# Patient Record
Sex: Female | Born: 2000 | Race: Black or African American | Hispanic: No | Marital: Single | Smoking: Never smoker
Health system: Southern US, Community
[De-identification: ages and names within clinical notes are randomized; demographics above are authoritative.]

## PROBLEM LIST (undated history)

## (undated) DIAGNOSIS — J45909 Unspecified asthma, uncomplicated: Secondary | ICD-10-CM

## (undated) DIAGNOSIS — R519 Headache, unspecified: Secondary | ICD-10-CM

## (undated) HISTORY — DX: Headache, unspecified: R51.9

## (undated) HISTORY — DX: Unspecified asthma, uncomplicated: J45.909

---

## 2019-09-04 ENCOUNTER — Ambulatory Visit: Payer: Self-pay | Attending: Family

## 2019-09-04 DIAGNOSIS — Z23 Encounter for immunization: Secondary | ICD-10-CM

## 2019-09-04 NOTE — Progress Notes (Signed)
   Covid-19 Vaccination Clinic  Name:  Carrie Santana    MRN: [id number] DOB: [birth year]  09/04/2019  [name] was observed post Covid-19 immunization for 15 minutes without incident. She was provided with Vaccine Information Sheet and instruction to access the V-Safe system.   [name] was instructed to call 911 with any severe reactions post vaccine: Marland Kitchen Difficulty breathing  . Swelling of face and throat  . A fast heartbeat  . A bad rash all over body  . Dizziness and weakness   Immunizations Administered    Name Date Dose VIS Date Route   Moderna COVID-19 Vaccine 09/04/2019 10:15 AM 0.5 mL 05/13/2019 Intramuscular   Manufacturer: Moderna   Lot: [id number]   NDC: 62952-841-32

## 2019-09-24 DIAGNOSIS — R05 Cough: Secondary | ICD-10-CM | POA: Diagnosis not present

## 2019-09-24 DIAGNOSIS — J301 Allergic rhinitis due to pollen: Secondary | ICD-10-CM | POA: Diagnosis not present

## 2019-10-07 ENCOUNTER — Ambulatory Visit: Payer: Self-pay | Attending: Family

## 2019-10-07 DIAGNOSIS — Z23 Encounter for immunization: Secondary | ICD-10-CM

## 2019-10-07 NOTE — Progress Notes (Signed)
   Covid-19 Vaccination Clinic  Name:  Carrie Santana    MRN: [id number] DOB: 03/22/2001  10/07/2019  Ms. Broccoli was observed post Covid-19 immunization for 15 minutes without incident. She was provided with Vaccine Information Sheet and instruction to access the V-Safe system.   [name] was instructed to call 911 with any severe reactions post vaccine: Marland Kitchen Difficulty breathing  . Swelling of face and throat  . A fast heartbeat  . A bad rash all over body  . Dizziness and weakness   Immunizations Administered    Name Date Dose VIS Date Route   Moderna COVID-19 Vaccine 10/07/2019 10:13 AM 0.5 mL 05/2019 Intramuscular   Manufacturer: Moderna   Lot: [id number]   NDC: 16945-038-88

## 2019-11-28 DIAGNOSIS — Z0000 Encounter for general adult medical examination without abnormal findings: Secondary | ICD-10-CM | POA: Diagnosis not present

## 2019-11-28 DIAGNOSIS — Z113 Encounter for screening for infections with a predominantly sexual mode of transmission: Secondary | ICD-10-CM | POA: Diagnosis not present

## 2019-12-01 DIAGNOSIS — A568 Sexually transmitted chlamydial infection of other sites: Secondary | ICD-10-CM | POA: Diagnosis not present

## 2019-12-01 DIAGNOSIS — Z113 Encounter for screening for infections with a predominantly sexual mode of transmission: Secondary | ICD-10-CM | POA: Diagnosis not present

## 2020-06-09 DIAGNOSIS — Z20822 Contact with and (suspected) exposure to covid-19: Secondary | ICD-10-CM | POA: Diagnosis not present

## 2020-06-09 DIAGNOSIS — R070 Pain in throat: Secondary | ICD-10-CM | POA: Diagnosis not present

## 2020-06-09 DIAGNOSIS — J020 Streptococcal pharyngitis: Secondary | ICD-10-CM | POA: Diagnosis not present

## 2020-09-16 ENCOUNTER — Ambulatory Visit: Payer: Self-pay | Admitting: Family Medicine

## 2020-09-21 ENCOUNTER — Ambulatory Visit: Payer: Self-pay | Admitting: Family Medicine

## 2020-11-05 DIAGNOSIS — Z20822 Contact with and (suspected) exposure to covid-19: Secondary | ICD-10-CM | POA: Diagnosis not present

## 2021-03-15 ENCOUNTER — Other Ambulatory Visit: Payer: Self-pay

## 2021-03-15 ENCOUNTER — Ambulatory Visit (INDEPENDENT_AMBULATORY_CARE_PROVIDER_SITE_OTHER): Payer: BC Managed Care – PPO | Admitting: Family Medicine

## 2021-03-15 VITALS — BP 137/98 | HR 92 | Resp 16 | Ht 65.0 in | Wt 165.4 lb

## 2021-03-15 DIAGNOSIS — J3489 Other specified disorders of nose and nasal sinuses: Secondary | ICD-10-CM | POA: Diagnosis not present

## 2021-03-15 DIAGNOSIS — Z7689 Persons encountering health services in other specified circumstances: Secondary | ICD-10-CM

## 2021-03-15 NOTE — Progress Notes (Signed)
Patient is here to est care with provider. Patient c/o pain in the nose x 24yr. Patient said it does not matter what she takes OTC it do not seem to help with her nose pain/ sinus pain.

## 2021-03-16 ENCOUNTER — Other Ambulatory Visit: Payer: Self-pay | Admitting: *Deleted

## 2021-03-16 ENCOUNTER — Telehealth: Payer: Self-pay | Admitting: Family Medicine

## 2021-03-16 ENCOUNTER — Encounter: Payer: Self-pay | Admitting: Family Medicine

## 2021-03-16 DIAGNOSIS — J3489 Other specified disorders of nose and nasal sinuses: Secondary | ICD-10-CM

## 2021-03-16 NOTE — Telephone Encounter (Signed)
Pt had a referral from last appt 03/15/21 with and ENT called her and said they would be unable to take her on as a new pt due to the provider retiring and she would need a new referral elsewhere

## 2021-03-16 NOTE — Progress Notes (Signed)
   New Patient Office Visit  Subjective:  Patient ID: Carrie Santana, female    DOB: Feb 02, 2001  Age: 20 y.o. MRN: [id number]  CC: No chief complaint on file.   HPI Carrie Santana presents for to establish care. Patient complains of nose pain for several weeks that is worsening. Patient denies known trauma or injury. Patient has used flonase and tylenol/nsaids in the past with minimal to no relief.   No past medical history on file.    No family history on file.  Social History   Socioeconomic History   Marital status: Single    Spouse name: Not on file   Number of children: Not on file   Years of education: Not on file   Highest education level: Not on file  Occupational History   Not on file  Tobacco Use   Smoking status: Not on file   Smokeless tobacco: Not on file  Substance and Sexual Activity   Alcohol use: Not on file   Drug use: Not on file   Sexual activity: Not on file  Other Topics Concern   Not on file  Social History Narrative   Not on file   Social Determinants of Health   Financial Resource Strain: Not on file  Food Insecurity: Not on file  Transportation Needs: Not on file  Physical Activity: Not on file  Stress: Not on file  Social Connections: Not on file  Intimate Partner Violence: Not on file    ROS Review of Systems  HENT:  Negative for congestion, nosebleeds, sinus pressure and sinus pain.   All other systems reviewed and are negative.  Objective:   Today's Vitals: There were no vitals taken for this visit.  Physical Exam Vitals and nursing note reviewed.  Constitutional:      General: She is not in acute distress. HENT:     Head: Normocephalic and atraumatic.     Nose: Nasal tenderness present. No congestion.     Right Sinus: No maxillary sinus tenderness or frontal sinus tenderness.     Left Sinus: No maxillary sinus tenderness.  Cardiovascular:     Rate and Rhythm: Normal rate and regular rhythm.  Pulmonary:     Effort:  Pulmonary effort is normal.     Breath sounds: Normal breath sounds.  Neurological:     General: No focal deficit present.     Mental Status: She is alert and oriented to person, place, and time.    Assessment & Plan:   1. Nasal pain Referral to ENT for further eval/mgt - Ambulatory referral to ENT  2. Encounter to establish care     No outpatient encounter medications on file as of 03/15/2021.   No facility-administered encounter medications on file as of 03/15/2021.    Follow-up: No follow-ups on file.   Tommie Raymond, MD

## 2021-03-16 NOTE — Telephone Encounter (Signed)
ENT referral has been place to Dr. Suszanne Conners

## 2021-03-22 DIAGNOSIS — Z113 Encounter for screening for infections with a predominantly sexual mode of transmission: Secondary | ICD-10-CM | POA: Diagnosis not present

## 2021-03-22 DIAGNOSIS — Z01419 Encounter for gynecological examination (general) (routine) without abnormal findings: Secondary | ICD-10-CM | POA: Diagnosis not present

## 2021-05-18 DIAGNOSIS — J310 Chronic rhinitis: Secondary | ICD-10-CM | POA: Diagnosis not present

## 2021-05-18 DIAGNOSIS — J342 Deviated nasal septum: Secondary | ICD-10-CM | POA: Diagnosis not present

## 2021-05-18 DIAGNOSIS — R519 Headache, unspecified: Secondary | ICD-10-CM | POA: Diagnosis not present

## 2021-05-18 DIAGNOSIS — J343 Hypertrophy of nasal turbinates: Secondary | ICD-10-CM | POA: Diagnosis not present

## 2021-05-27 ENCOUNTER — Other Ambulatory Visit: Payer: Self-pay | Admitting: Otolaryngology

## 2021-05-27 DIAGNOSIS — J329 Chronic sinusitis, unspecified: Secondary | ICD-10-CM

## 2021-06-23 ENCOUNTER — Other Ambulatory Visit: Payer: Self-pay

## 2021-06-23 ENCOUNTER — Ambulatory Visit
Admission: RE | Admit: 2021-06-23 | Discharge: 2021-06-23 | Disposition: A | Discharge status: Home / Self Care | Payer: BC Managed Care – PPO | Source: Ambulatory Visit | Attending: Otolaryngology | Admitting: Otolaryngology

## 2021-06-23 DIAGNOSIS — J329 Chronic sinusitis, unspecified: Secondary | ICD-10-CM

## 2021-06-23 DIAGNOSIS — J342 Deviated nasal septum: Secondary | ICD-10-CM | POA: Diagnosis not present

## 2021-06-23 DIAGNOSIS — J3489 Other specified disorders of nose and nasal sinuses: Secondary | ICD-10-CM | POA: Diagnosis not present

## 2021-07-27 ENCOUNTER — Encounter: Payer: Self-pay | Admitting: *Deleted

## 2021-07-27 NOTE — Progress Notes (Signed)
[facility]  PATIENT: Carrie Santana DOB: 29-Apr-2001  REFERRING CLINICIAN: Newman Pies, MD HISTORY FROM: self REASON FOR VISIT: facial pain, headaches   HISTORICAL  CHIEF COMPLAINT:  Chief Complaint  Patient presents with   Follow-up    RM 1 Alone Pt is well, has been having headaches and facial pain for about 2 yrs. Most of the time she has the pain 1-2 times a week but recently had it for one week.  Occasional nausea and fatigue, rare occasions of sensitivity to light     HISTORY OF PRESENT ILLNESS:  The patient presents for evaluation of headaches and facial pain which began 2 years ago. Headaches occur once every 1-2 weeks. They are described as unilateral frontal throbbing which can occur on either side of her head. Will have occasional photophobia and phonophobia, always has nausea. Headaches can last all day. She will occasionally take Motrin or Tylenol as needed, but this typically does not help.  She had a CT maxillofacial scan which was unremarkable and did not show evidence of sinus disease.  Onset: 2 year ago Triggers: none Aura: floaters in her vision (rare, has not had this for ~1 year) Location: frontal, bridge of nose, unilateral (either side) Quality/Description: throbbing Associated Symptoms:  Photophobia: yes  Phonophobia: yes  Nausea: yes Worse with activity?: yes Duration of headaches: 24 hours   OTHER MEDICAL CONDITIONS: none   REVIEW OF SYSTEMS: Full 14 system review of systems performed and negative with exception of: face pain, headaches  ALLERGIES: Allergies  Allergen Reactions   Amoxicillin Hives and Rash    HOME MEDICATIONS: Outpatient Medications Prior to Visit  Medication Sig Dispense Refill   ZAFEMY 150-35 MCG/24HR transdermal patch 1 patch once a week.     No facility-administered medications prior to visit.    PAST MEDICAL HISTORY: Past Medical History:  Diagnosis Date   Asthma    Facial pain    Headache      PAST SURGICAL HISTORY: History reviewed. No pertinent surgical history.  FAMILY HISTORY: Family History  Problem Relation Age of Onset   Migraines Mother    Migraines Father   No family history of brain tumors or aneurysms  SOCIAL HISTORY: Social History   Socioeconomic History   Marital status: Single    Spouse name: Not on file   Number of children: Not on file   Years of education: Not on file   Highest education level: Not on file  Occupational History    Comment: college student  Tobacco Use   Smoking status: Never   Smokeless tobacco: Never  Substance and Sexual Activity   Alcohol use: Yes    Comment: once a week   Drug use: Never   Sexual activity: Not on file  Other Topics Concern   Not on file  Social History Narrative   Not on file   Social Determinants of Health   Financial Resource Strain: Not on file  Food Insecurity: Not on file  Transportation Needs: Not on file  Physical Activity: Not on file  Stress: Not on file  Social Connections: Not on file  Intimate Partner Violence: Not on file     PHYSICAL EXAM GENERAL EXAM/CONSTITUTIONAL: Vitals:  Vitals:   07/28/21 0926  BP: 127/81  Pulse: 78  Weight: 178 lb (80.7 kg)  Height: 5\' 5"  (1.651 m)   Body mass index is 29.62 kg/m. Wt Readings from Last 3 Encounters:  07/28/21 178 lb (80.7 kg)  03/15/21 165 lb 6.4  oz (75 kg)   Patient is in no distress; well developed, nourished and groomed; neck is supple  CARDIOVASCULAR: Examination of peripheral vascular system by observation and palpation is normal  EYES: Pupils round and reactive to light, Visual fields full to confrontation, Extraocular movements intact  MUSCULOSKELETAL: Gait, strength, tone, movements noted in Neurologic exam below  NEUROLOGIC: MENTAL STATUS:  awake, alert, oriented to person, place and time recent and remote memory intact normal attention and concentration  CRANIAL NERVE:  2nd, 3rd, 4th, 6th - pupils  equal and reactive to light, visual fields full to confrontation, extraocular muscles intact, no nystagmus 5th - facial sensation symmetric 7th - facial strength symmetric 8th - hearing intact 9th - palate elevates symmetrically, uvula midline 11th - shoulder shrug symmetric 12th - tongue protrusion midline  MOTOR:  normal bulk and tone, full strength in the BUE, BLE  SENSORY:  normal and symmetric to light touch all 4 extremities  COORDINATION:  finger-nose-finger intact bilaterally  REFLEXES:  deep tendon reflexes present and symmetric  GAIT/STATION:  normal     DIAGNOSTIC DATA (LABS, IMAGING, TESTING) - I reviewed patient records, labs, notes, testing and imaging myself where available.  CT maxillofacial 06/23/21: unremarkable  ASSESSMENT AND PLAN  21 y.o. year old female who presents for evaluation of headaches and face pain over the past 2 years. Her symptoms are most consistent with episodic migraines. Discussed diagnosis of migraine and how they are often mistaken for sinus headaches. Will start Maxalt as needed for rescue.   No diagnosis found.    PLAN: -Rescue: Start Maxalt 10 mg PRN -next steps: consider daily preventive if headaches increase in frequency  Meds ordered this encounter  Medications   rizatriptan (MAXALT) 10 MG tablet    Sig: Take 1 tablet (10 mg total) by mouth as needed for migraine. May repeat in 2 hours if needed    Dispense:  10 tablet    Refill:  3    Return in about 3 months (around 10/25/2021).    Ocie Doyne, MD 07/28/21 9:52 AM  I spent an average of 19 minutes chart reviewing and counseling the patient, with at least 50% of the time face to face with the patient. Headache education was done. Discussed treatment options including acute medications. Discussed side effects and drug interactions.  [facility] [address] [place], [place] [address] [phone]

## 2021-07-28 ENCOUNTER — Encounter: Payer: Self-pay | Admitting: Psychiatry

## 2021-07-28 ENCOUNTER — Ambulatory Visit: Payer: BC Managed Care – PPO | Admitting: Psychiatry

## 2021-07-28 VITALS — BP 127/81 | HR 78 | Ht 65.0 in | Wt 178.0 lb

## 2021-07-28 DIAGNOSIS — G43109 Migraine with aura, not intractable, without status migrainosus: Secondary | ICD-10-CM

## 2021-07-28 MED ORDER — RIZATRIPTAN BENZOATE 10 MG PO TABS: 10.0000 mg | ORAL_TABLET | ORAL | 3 refills | Status: DC | PRN

## 2021-07-28 NOTE — Patient Instructions (Addendum)
Start rizatriptan 10 mg as needed for migraines. Take at the onset of migraine. If headache recurs or does not fully resolve, you may take a second dose after 2 hours. Please avoid taking more than 2 days per week to avoid rebound headaches  Your headaches are consistent with migraines. Migraines are moderate to severe headaches which generally have throbbing pain, sensitivity to light/sound, and nausea. They can often be mistaken for sinus headaches as they can cause pain and pressure in the face

## 2021-09-08 ENCOUNTER — Ambulatory Visit
Admission: RE | Admit: 2021-09-08 | Discharge: 2021-09-08 | Disposition: A | Discharge status: Home / Self Care | Payer: BC Managed Care – PPO | Source: Ambulatory Visit | Attending: Emergency Medicine | Admitting: Emergency Medicine

## 2021-09-08 VITALS — BP 129/88 | HR 98 | Temp 98.1°F | Resp 18

## 2021-09-08 DIAGNOSIS — J020 Streptococcal pharyngitis: Secondary | ICD-10-CM | POA: Diagnosis not present

## 2021-09-08 DIAGNOSIS — J029 Acute pharyngitis, unspecified: Secondary | ICD-10-CM | POA: Diagnosis not present

## 2021-09-08 LAB — POCT RAPID STREP A (OFFICE): Rapid Strep A Screen: POSITIVE — AB

## 2021-09-08 MED ORDER — IBUPROFEN 400 MG PO TABS
400.0000 mg | ORAL_TABLET | Freq: Three times a day (TID) | ORAL | 0 refills | Status: AC | PRN
Start: 2021-09-08 — End: ?

## 2021-09-08 MED ORDER — CEFDINIR 300 MG PO CAPS
300.0000 mg | ORAL_CAPSULE | Freq: Two times a day (BID) | ORAL | 0 refills | Status: AC
Start: 2021-09-08 — End: 2021-09-18

## 2021-09-08 MED ORDER — IBUPROFEN 800 MG PO TABS
800.0000 mg | ORAL_TABLET | Freq: Once | ORAL | Status: AC
  Administered 2021-09-08: 800 mg via ORAL

## 2021-09-08 NOTE — Progress Notes (Signed)
?[facility] ? ? ? ?CSN: [id number] ?Arrival date & time:    ?  ? ?HISTORY  ? ?Chief Complaint  ?Patient presents with  ? Sore Throat  ?  Feels like strep. - Entered by patient  ? ?HPI ?Carrie Santana is a 21 y.o. female. Pt c/o sore/ sharp sensation in throat that began yesterday.  Patient reports a history of streptococcal pharyngitis last year, states symptoms today are similar.  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  Patient denies headache and upset stomach.  Patient denies congestion, cough, fever, otalgia. ? ?The history is provided by the patient.  ?Past Medical History:  ?Diagnosis Date  ? Asthma   ? Facial pain   ? Headache   ? ?There are no problems to display for this patient. ? ?History reviewed. No pertinent surgical history. ?OB History   ?No obstetric history on file. ?  ? ?Home Medications   ? ?Prior to Admission medications   ?Medication Sig Start Date End Date Taking? Authorizing Provider  ?rizatriptan (MAXALT) 10 MG tablet Take 1 tablet (10 mg total) by mouth as needed for migraine. May repeat in 2 hours if needed 07/28/21   Ocie Doyne, MD  ?[facility] 150-35 MCG/24HR transdermal patch 1 patch once a week. 06/20/21   [provider]  ? ?Family History ?Family History  ?Problem Relation Age of Onset  ? Migraines Mother   ? Migraines Father   ? ?Social History ?Social History  ? ?Tobacco Use  ? Smoking status: Never  ? Smokeless tobacco: Never  ?Substance Use Topics  ? Alcohol use: Yes  ?  Comment: once a week  ? Drug use: Never  ? ?Allergies   ?Amoxicillin ? ?Review of Systems ?Review of Systems ?Pertinent findings noted in history of present illness.  ? ?Physical Exam ?Triage Vital Signs ?ED Triage Vitals  ?Enc Vitals Group  ?   BP 04/08/21 0827 (!) 147/82  ?   Pulse Rate 04/08/21 0827 72  ?   Resp 04/08/21 0827 18  ?   Temp 04/08/21 0827 98.3 ?F (36.8 ?C)  ?   Temp Source 04/08/21 0827 Oral  ?   SpO2 04/08/21 0827 98 %  ?    Weight --   ?   Height --   ?   Head Circumference --   ?   Peak Flow --   ?   Pain Score 04/08/21 0826 5  ?   Pain Loc --   ?   Pain Edu? --   ?   Excl. in GC? --   ?No data found. ? ?Updated Vital Signs ?BP 129/88 (BP Location: Right Arm)   Pulse 98   Temp 98.1 ?F (36.7 ?C) (Oral)   Resp 18   LMP 09/05/2021   SpO2 98%  ? ?Physical Exam ?Constitutional:   ?   General: She is not in acute distress. ?   Appearance: She is well-developed. She is not ill-appearing or toxic-appearing.  ?HENT:  ?   Head: Normocephalic and atraumatic.  ?   Salivary Glands: Right salivary gland is diffusely enlarged and tender. Left salivary gland is diffusely enlarged and tender.  ?   Right Ear: Hearing and external ear normal.  ?   Left Ear: Hearing and external ear normal.  ?   Ears:  ?   Comments: Bilateral EACs with mild erythema, bilateral TMs are normal ?   Nose: No mucosal edema, congestion or rhinorrhea.  ?  Right Turbinates: Not enlarged, swollen or pale.  ?   Left Turbinates: Not enlarged or swollen.  ?   Right Sinus: No maxillary sinus tenderness or frontal sinus tenderness.  ?   Left Sinus: No maxillary sinus tenderness or frontal sinus tenderness.  ?   Mouth/Throat:  ?   Lips: Pink. No lesions.  ?   Mouth: Mucous membranes are moist. No oral lesions or angioedema.  ?   Dentition: No gingival swelling.  ?   Tongue: No lesions.  ?   Palate: No mass.  ?   Pharynx: Uvula midline. Pharyngeal swelling, oropharyngeal exudate and posterior oropharyngeal erythema present. No uvula swelling.  ?   Tonsils: Tonsillar exudate present. 2+ on the right. 2+ on the left.  ?Eyes:  ?   Extraocular Movements: Extraocular movements intact.  ?   Conjunctiva/sclera: Conjunctivae normal.  ?   Pupils: Pupils are equal, round, and reactive to light.  ?Neck:  ?   Thyroid: No thyroid mass, thyromegaly or thyroid tenderness.  ?   Trachea: Tracheal tenderness present. No abnormal tracheal secretions or tracheal deviation.  ?   Comments: Voice is  muffled ?Cardiovascular:  ?   Rate and Rhythm: Normal rate and regular rhythm.  ?   Pulses: Normal pulses.  ?   Heart sounds: Normal heart sounds, S1 normal and S2 normal. No murmur heard. ?  No friction rub. No gallop.  ?Pulmonary:  ?   Effort: Pulmonary effort is normal. No accessory muscle usage, prolonged expiration, respiratory distress or retractions.  ?   Breath sounds: No stridor, decreased air movement or transmitted upper airway sounds. No decreased breath sounds, wheezing, rhonchi or rales.  ?Abdominal:  ?   General: Bowel sounds are normal.  ?   Palpations: Abdomen is soft.  ?   Tenderness: There is generalized abdominal tenderness. There is no right CVA tenderness, left CVA tenderness or rebound. Negative signs include Murphy's sign.  ?   Hernia: No hernia is present.  ?Musculoskeletal:     ?   General: No tenderness. Normal range of motion.  ?   Cervical back: Full passive range of motion without pain, normal range of motion and neck supple.  ?   Right lower leg: No edema.  ?   Left lower leg: No edema.  ?Lymphadenopathy:  ?   Cervical: Cervical adenopathy present.  ?   Right cervical: Superficial cervical adenopathy present.  ?   Left cervical: Superficial cervical adenopathy present.  ?Skin: ?   General: Skin is warm and dry.  ?   Findings: No erythema, lesion or rash.  ?Neurological:  ?   General: No focal deficit present.  ?   Mental Status: She is alert and oriented to person, place, and time. Mental status is at baseline.  ?Psychiatric:     ?   Mood and Affect: Mood normal.     ?   Behavior: Behavior normal.     ?   Thought Content: Thought content normal.     ?   Judgment: Judgment normal.  ? ? ?Visual Acuity ?Right Eye Distance:   ?Left Eye Distance:   ?Bilateral Distance:   ? ?Right Eye Near:   ?Left Eye Near:    ?Bilateral Near:    ? ?UC Couse / Diagnostics / Procedures:  ?  ?EKG ? ?Radiology ?No results found. ? ?Procedures ?Procedures (including critical care time) ? ?UC Diagnoses / Final  Clinical Impressions(s)   ?I have reviewed the triage vital signs  and the nursing notes. ? ?Pertinent labs & imaging results that were available during my care of the patient were reviewed by me and considered in my medical decision making (see chart for details).   ?Final diagnoses:  ?Acute pharyngitis, unspecified etiology  ?Streptococcal pharyngitis  ? ?Rapid strep test today is positive.  Patient will be treated with antibiotics for 10 days.  Return precautions advised.  Patient also provided with a prescription for ibuprofen. ? ?ED Prescriptions   ? ? Medication Sig Dispense Auth. Provider  ? cefdinir (OMNICEF) 300 MG capsule Take 1 capsule (300 mg total) by mouth 2 (two) times daily for 10 days. 20 capsule Theadora Rama Scales, PA-C  ? ibuprofen (ADVIL) 400 MG tablet Take 1 tablet (400 mg total) by mouth every 8 (eight) hours as needed for up to 30 doses. 30 tablet Theadora Rama Scales, PA-C  ? ?  ? ?PDMP not reviewed this encounter. ? ?Pending results:  ?Labs Reviewed  ?POCT RAPID STREP A (OFFICE) - Abnormal; Notable for the following components:  ?    Result Value  ? Rapid Strep A Screen Positive (*)   ? All other components within normal limits  ? ? ?Medications Ordered in UC: ?Medications  ?ibuprofen (ADVIL) tablet 800 mg (800 mg Oral Given 09/08/21 0933)  ? ? ?Disposition Upon Discharge:  ?Condition: stable for discharge home ?Home: take medications as prescribed; routine discharge instructions as discussed; follow up as advised. ? ?Patient presented with an acute illness with associated systemic symptoms and significant discomfort requiring urgent management. In my opinion, this is a condition that a prudent lay person (someone who possesses an average knowledge of health and medicine) may potentially expect to result in complications if not addressed urgently such as respiratory distress, impairment of bodily function or dysfunction of bodily organs.  ? ?Routine symptom specific, illness specific  and/or disease specific instructions were discussed with the patient and/or caregiver at length.  ? ?As such, the patient has been evaluated and assessed, work-up was performed and treatment was provided in alignm

## 2021-09-08 NOTE — Discharge Instructions (Addendum)
Your rapid strep test today is positive.  Please begin antibiotics now.  A prescription has been sent to your pharmacy.  Please take all doses as prescribed.  Please discard your toothbrush and any other oral devices that you are currently using and replace them with new ones.   ? ?After taking antibiotics for 24 hours, you are no longer considered contagious and should begin to feel much better.   ? ?Please see the list below for recommended medications, dosages and frequencies to provide relief of your current symptoms:   ?  ?Cefdinir (Omnicef):  1 capsule twice daily for 10 days, you can take it with or without food.  This antibiotic can cause upset stomach, this will resolve once antibiotics are complete.  You are welcome to use a probiotic, eat yogurt, take Imodium while taking this medication.  Please avoid other systemic medications such as Maalox, Pepto-Bismol or milk of magnesia as they can interfere with your body's ability to absorb the antibiotics. ?  ?Ibuprofen  (Advil, Motrin): This is a good anti-inflammatory medication which addresses aches, pains and inflammation of the upper airways that causes sinus and nasal congestion as well as in the lower airways which makes your cough feel tight and sometimes burn.  I recommend that you take between 400 to 600 mg every 6-8 hours as needed, I have provided you with a prescription for 400 mg.    ?  ?Please follow-up within the next 7-10 days either with your primary care provider or urgent care if your symptoms do not resolve.  If you do not have a primary care provider, we will assist you in finding one. ?  ?Thank you for visiting urgent care today.  We appreciate the opportunity to participate in your care. ? ?

## 2021-09-08 NOTE — ED Provider Notes (Signed)
?[facility] ? ? ? ?CSN: [id number] ?Arrival date & time: 09/08/21  0845 ?  ? ?HISTORY  ? ?Chief Complaint  ?Patient presents with  ? Sore Throat  ?  Feels like strep. - Entered by patient  ? ?HPI ?Carrie Santana is a 21 y.o. female. Patient states that yesterday morning she began to have a sore throat but yesterday afternoon began to have a familiar sharp sensation resembling a previous episode of streptococcal pharyngitis.  Patient states she would like to be tested for strep today.  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  Patient denies known sick contacts.  Patient denies headache, abdominal pain, rash. ? ?The history is provided by the patient.  ?Past Medical History:  ?Diagnosis Date  ? Asthma   ? Facial pain   ? Headache   ? ?There are no problems to display for this patient. ? ?History reviewed. No pertinent surgical history. ?OB History   ?No obstetric history on file. ?  ? ?Home Medications   ? ?Prior to Admission medications   ?Medication Sig Start Date End Date Taking? Authorizing Provider  ?cefdinir (OMNICEF) 300 MG capsule Take 1 capsule (300 mg total) by mouth 2 (two) times daily for 10 days. 09/08/21 09/18/21 Yes Theadora Rama Scales, PA-C  ?ibuprofen (ADVIL) 400 MG tablet Take 1 tablet (400 mg total) by mouth every 8 (eight) hours as needed for up to 30 doses. 09/08/21  Yes Theadora Rama Scales, PA-C  ?rizatriptan (MAXALT) 10 MG tablet Take 1 tablet (10 mg total) by mouth as needed for migraine. May repeat in 2 hours if needed 07/28/21   Ocie Doyne, MD  ?[facility] 150-35 MCG/24HR transdermal patch 1 patch once a week. 06/20/21   [provider]  ? ?Family History ?Family History  ?Problem Relation Age of Onset  ? Migraines Mother   ? Migraines Father   ? ?Social History ?Social History  ? ?Tobacco Use  ? Smoking status: Never  ? Smokeless tobacco: Never  ?Substance Use Topics  ? Alcohol use: Yes  ?  Comment: once a week  ? Drug use: Never  ? ?Allergies    ?Amoxicillin ? ?Review of Systems ?Review of Systems ?Pertinent findings noted in history of present illness.  ? ?Physical Exam ?Triage Vital Signs ?ED Triage Vitals  ?Enc Vitals Group  ?   BP 04/08/21 0827 (!) 147/82  ?   Pulse Rate 04/08/21 0827 72  ?   Resp 04/08/21 0827 18  ?   Temp 04/08/21 0827 98.3 ?F (36.8 ?C)  ?   Temp Source 04/08/21 0827 Oral  ?   SpO2 04/08/21 0827 98 %  ?   Weight --   ?   Height --   ?   Head Circumference --   ?   Peak Flow --   ?   Pain Score 04/08/21 0826 5  ?   Pain Loc --   ?   Pain Edu? --   ?   Excl. in GC? --   ?No data found. ? ?Updated Vital Signs ?BP 129/88 (BP Location: Right Arm)   Pulse 98   Temp 98.1 ?F (36.7 ?C) (Oral)   Resp 18   LMP 09/05/2021   SpO2 98%  ? ?Physical Exam ?Constitutional:   ?   General: She is not in acute distress. ?   Appearance: She is well-developed. She is ill-appearing. She is not toxic-appearing.  ?HENT:  ?   Head: Normocephalic and atraumatic.  ?  Salivary Glands: Right salivary gland is diffusely enlarged and tender. Left salivary gland is diffusely enlarged and tender.  ?   Right Ear: Hearing and external ear normal.  ?   Left Ear: Hearing and external ear normal.  ?   Ears:  ?   Comments: Bilateral EACs with mild erythema, bilateral TMs are normal ?   Nose: No mucosal edema, congestion or rhinorrhea.  ?   Right Turbinates: Not enlarged, swollen or pale.  ?   Left Turbinates: Not enlarged or swollen.  ?   Right Sinus: No maxillary sinus tenderness or frontal sinus tenderness.  ?   Left Sinus: No maxillary sinus tenderness or frontal sinus tenderness.  ?   Mouth/Throat:  ?   Lips: Pink. No lesions.  ?   Mouth: Mucous membranes are moist. No oral lesions or angioedema.  ?   Dentition: No gingival swelling.  ?   Tongue: No lesions.  ?   Palate: No mass.  ?   Pharynx: Uvula midline. Pharyngeal swelling, oropharyngeal exudate and posterior oropharyngeal erythema present. No uvula swelling.  ?   Tonsils: Tonsillar exudate present. 1+ on the  right. 1+ on the left.  ?Eyes:  ?   Extraocular Movements: Extraocular movements intact.  ?   Conjunctiva/sclera: Conjunctivae normal.  ?   Pupils: Pupils are equal, round, and reactive to light.  ?Neck:  ?   Thyroid: No thyroid mass, thyromegaly or thyroid tenderness.  ?   Trachea: Tracheal tenderness present. No abnormal tracheal secretions or tracheal deviation.  ?   Comments: Voice is muffled ?Cardiovascular:  ?   Rate and Rhythm: Normal rate and regular rhythm.  ?   Pulses: Normal pulses.  ?   Heart sounds: Normal heart sounds, S1 normal and S2 normal. No murmur heard. ?  No friction rub. No gallop.  ?Pulmonary:  ?   Effort: Pulmonary effort is normal. No accessory muscle usage, prolonged expiration, respiratory distress or retractions.  ?   Breath sounds: No stridor, decreased air movement or transmitted upper airway sounds. No decreased breath sounds, wheezing, rhonchi or rales.  ?Abdominal:  ?   General: Bowel sounds are normal.  ?   Palpations: Abdomen is soft.  ?   Tenderness: There is generalized abdominal tenderness. There is no right CVA tenderness, left CVA tenderness or rebound. Negative signs include Murphy's sign.  ?   Hernia: No hernia is present.  ?Musculoskeletal:     ?   General: No tenderness. Normal range of motion.  ?   Cervical back: Full passive range of motion without pain, normal range of motion and neck supple.  ?   Right lower leg: No edema.  ?   Left lower leg: No edema.  ?Lymphadenopathy:  ?   Cervical: Cervical adenopathy present.  ?   Right cervical: Superficial cervical adenopathy present.  ?   Left cervical: Superficial cervical adenopathy present.  ?Skin: ?   General: Skin is warm and dry.  ?   Findings: No erythema, lesion or rash.  ?Neurological:  ?   General: No focal deficit present.  ?   Mental Status: She is alert and oriented to person, place, and time. Mental status is at baseline.  ?Psychiatric:     ?   Mood and Affect: Mood normal.     ?   Behavior: Behavior normal.      ?   Thought Content: Thought content normal.     ?   Judgment: Judgment normal.  ? ? ?  Visual Acuity ?Right Eye Distance:   ?Left Eye Distance:   ?Bilateral Distance:   ? ?Right Eye Near:   ?Left Eye Near:    ?Bilateral Near:    ? ?[facility] Couse / Diagnostics / Procedures:  ?  ?EKG ? ?Radiology ?No results found. ? ?Procedures ?Procedures (including critical care time) ? ?UC Diagnoses / Final Clinical Impressions(s)   ?I have reviewed the triage vital signs and the nursing notes. ? ?Pertinent labs & imaging results that were available during my care of the patient were reviewed by me and considered in my medical decision making (see chart for details).   ?Final diagnoses:  ?Acute pharyngitis, unspecified etiology  ?Streptococcal pharyngitis  ? ?Rapid strep test today is positive.  Patient provided with a prescription for cefdinir given low severity of allergy to amoxicillin.  Patient provided with ibuprofen during her visit today as well as a prescription.  Return precautions advised. ? ?ED Prescriptions   ? ? Medication Sig Dispense Auth. Provider  ? cefdinir (OMNICEF) 300 MG capsule Take 1 capsule (300 mg total) by mouth 2 (two) times daily for 10 days. 20 capsule Theadora Rama Scales, PA-C  ? ibuprofen (ADVIL) 400 MG tablet Take 1 tablet (400 mg total) by mouth every 8 (eight) hours as needed for up to 30 doses. 30 tablet Theadora Rama Scales, PA-C  ? ?  ? ?PDMP not reviewed this encounter. ? ?Pending results:  ?Labs Reviewed  ?POCT RAPID STREP A (OFFICE) - Abnormal; Notable for the following components:  ?    Result Value  ? Rapid Strep A Screen Positive (*)   ? All other components within normal limits  ? ? ?Medications Ordered in UC: ?Medications  ?ibuprofen (ADVIL) tablet 800 mg (has no administration in time range)  ? ? ?Disposition Upon Discharge:  ?Condition: stable for discharge home ?Home: take medications as prescribed; routine discharge instructions as discussed; follow up as advised. ? ?Patient  presented with an acute illness with associated systemic symptoms and significant discomfort requiring urgent management. In my opinion, this is a condition that a prudent lay person (someone who possesses an average knowle

## 2021-09-08 NOTE — ED Triage Notes (Signed)
Pt c/o sore/ sharp sensation in throat that began yesterday. ?

## 2021-09-08 NOTE — Patient Instructions (Incomplete)
Your rapid strep test today is negative.  Streptococcal throat culture will be performed per our protocol because of the rapid strep test that we perform here at urgent care only catches 40% of known cases of group A streptococcal pharyngitis and tonsillitis.  Based on my physical exam today and the history that you provided, I believe that you have bacterial pharyngitis.    ?Because you have significant swelling of both tonsils, significant redness of both tonsils and white patches on both tonsils and pain with swallowing, I have provided you with a prescription for antibiotics that would like for you to begin to take now while we await the results of your throat culture.  Bacterial pharyngitis is most commonly caused by bacteria called group A Streptococcus but there are many other culprits.   ?Once you have been on antibiotics for a full 24 hours, you are no longer considered contagious.   Please discard your toothbrush and any other oral devices that you are currently using and replace them with new ones today.    ?If you receive a phone call telling you that your throat culture is negative, I still strongly recommend that you complete your entire prescription of antibiotics regardless of the result of your throat culture, particularly if you begin to feel better within the first 48 hours of therapy.  The throat culture we perform here at urgent care only tests for group A strep and not any of the other bacteria that can also cause bacterial pharyngitis.   ?Your rapid strep test today is positive.  Please begin antibiotics now.  A prescription has been sent to your pharmacy.  Please take all doses as prescribed.  Please discard your toothbrush and any other oral devices that you are currently using and replace them with new ones.   ?Your rapid strep test today is positive.  You were provided with an injection of Bicillin LA during your visit today to treat this.  No further antibiotic therapy is recommended or  needed.  Please discard your toothbrush and any other oral devices that you are currently using and replace them with new ones today.   ?After taking antibiotics for 24 hours, you are no longer considered contagious and should begin to feel much better.  Please follow-up with your primary care provider in the next week to ten days for repeat evaluation if you have not had complete resolution of your symptoms. ?  ?Your strep test today is negative.  Throat culture will be performed per our protocol.  The result of your throat culture will be posted to your MyChart once it is complete, this typically takes 3 to 5 days.  If there is a positive result, you will be contacted by phone and antibiotics will be prescribed for you.  Please discard your toothbrush and any other oral devices that you are currently using and replace them with new ones if your strep culture is positive within 24 hours of beginning antibiotics. ?  ?Please see the list below for recommended medications, dosages and frequencies to provide relief of your current symptoms:   ?  ?Cefdinir (Omnicef):  1 capsule twice daily for 10 days, you can take it with or without food.  This antibiotic can cause upset stomach, this will resolve once antibiotics are complete.  You are welcome to use a probiotic, eat yogurt, take Imodium while taking this medication.  Please avoid other systemic medications such as Maalox, Pepto-Bismol or milk of magnesia as they can interfere  with your body's ability to absorb the antibiotics. ?  ?Methylprednisolone (Medrol Dosepak): This is a steroid that will significantly calm your upper and lower airways, please take one row of tablets daily with your breakfast meal starting tomorrow morning until the prescription is complete.    ?  ?Ibuprofen  (Advil, Motrin): This is a good anti-inflammatory medication which addresses aches, pains and inflammation of the upper airways that causes sinus and nasal congestion as well as in the  lower airways which makes your cough feel tight and sometimes burn.  I recommend that you take between 400 to 600 mg every 6-8 hours as needed, I have provided you with a prescription for *** mg.    ?  ?Chloraseptic Throat Spray: Spray 5 sprays into affected area every 2 hours, hold for 15 seconds and either swallow or spit it out.  This is a excellent numbing medication because it is a spray, you can put it right where you needed and so sucking on a lozenge and numbing your entire mouth.    ?  ?Lidocaine (Xylocaine): This is a numbing medication that can be swished for 15 seconds and swallowed.  You can use this every 3 hours while awake to relieve pain in your mouth and throat.  I have sent a prescription for this medication to your pharmacy. ?  ?Conservative care is also recommended at this time.  This includes rest, pushing clear fluids and activity as tolerated.  Warm beverages such as teas and broths versus cold beverages/popsicles and frozen sherbet/sorbet are your choice, both warm and cold are beneficial.  You may also notice that your appetite is reduced; this is okay as long as you are drinking plenty of clear fluids.  ?  ?Please follow-up within the next 7-10 days either with your primary care provider or urgent care if your symptoms do not resolve.  If you do not have a primary care provider, we will assist you in finding one. ?  ?Thank you for visiting urgent care today.  We appreciate the opportunity to participate in your care. ? ?

## 2021-11-14 ENCOUNTER — Ambulatory Visit: Payer: BC Managed Care – PPO | Admitting: Psychiatry

## 2021-11-14 ENCOUNTER — Encounter: Payer: Self-pay | Admitting: Psychiatry

## 2021-11-14 VITALS — BP 114/77 | HR 89 | Ht 65.0 in | Wt 179.0 lb

## 2021-11-14 DIAGNOSIS — G43109 Migraine with aura, not intractable, without status migrainosus: Secondary | ICD-10-CM | POA: Diagnosis not present

## 2021-11-14 MED ORDER — RIZATRIPTAN BENZOATE 10 MG PO TABS: 10.0000 mg | ORAL_TABLET | ORAL | 11 refills | Status: AC | PRN

## 2021-11-14 NOTE — Patient Instructions (Signed)
Natural supplements that can reduce migraines: Magnesium Oxide or Magnesium Glycinate 500 mg at bed (up to 800 mg daily) Coenzyme Q10 300 mg in AM Vitamin B2- 200 mg twice a day  Add 1 supplement at a time since even natural supplements can have undesirable side effects. You can sometimes buy supplements cheaper (especially Coenzyme Q10) at www.[employer].com or at [employer].  Magnesium: Magnesium (250 mg twice a day or 500 mg at bed) has a relaxant effect on smooth muscles such as blood vessels. Individuals suffering from frequent or daily headache usually have low magnesium levels which can be increase with daily supplementation of 400-800 mg. Three trials found 40-90% average headache reduction  when used as a preventative. Magnesium also demonstrated the benefit in menstrually related migraine.  Magnesium is part of the messenger system in the serotonin cascade and it is a good muscle relaxant.  It is also useful for constipation. Good sources include nuts, whole grains, and tomatoes. Side Effects: loose stool/diarrhea  Riboflavin (vitamin B 2) 200 mg twice a day. This vitamin assists nerve cells in the production of ATP a principal energy storing molecule.  It is necessary for many chemical reactions in the body.  There have been at least 3 clinical trials of riboflavin using 400 mg per day all of which suggested that migraine frequency can be decreased.  All 3 trials showed significant improvement in over half of migraine sufferers.  The supplement is found in bread, cereal, milk, meat, and poultry.  Most Americans get more riboflavin than the recommended daily allowance, however riboflavin deficiency is not necessary for the supplements to help prevent headache. Side effects: energizing, green urine  Coenzyme Q10: This is present in almost all cells in the body and is critical component for the conversion of energy.  Recent studies have shown that a nutritional supplement of CoQ10 can reduce the  frequency of migraine attacks by improving the energy production of cells as with riboflavin.  Doses of 150 mg twice a day have been shown to be effective.  

## 2021-11-14 NOTE — Progress Notes (Signed)
   CC:  headaches, facial pain  Follow-up Visit  Last visit: 07/28/21  Brief HPI: 21 year old female without significant medical history who follows in clinic for migraines.  At her last visit she was started on Maxalt for rescue.  Interval History: Since her last visit she has had improvement in her migraines. Maxalt works well to resolve her headache. She typically has to take this 5 days per month. It makes her drowsy, but she does not mind this as most of her migraines occur at night. She has not had to repeat a dose.   Headache days per month: 5 Headache free days per month: 25  Current Headache Regimen: Preventative: none Abortive: Maxalt 10 mg PRN   Prior Therapies                                  Maxalt 10 mg PRN  Physical Exam:   Vital Signs: BP 114/77   Pulse 89   Ht 5\' 5"  (1.651 m)   Wt 179 lb (81.2 kg)   BMI 29.79 kg/m  GENERAL:  well appearing, in no acute distress, alert  SKIN:  Color, texture, turgor normal. No rashes or lesions HEAD:  Normocephalic/atraumatic. RESP: normal respiratory effort MSK:  No gross joint deformities.   NEUROLOGICAL: Mental Status: Alert, oriented to person, place and time, Follows commands, and Speech fluent and appropriate. Cranial Nerves: PERRL, face symmetric, no dysarthria, hearing grossly intact Motor: moves all extremities equally Gait: normal-based.  IMPRESSION: 21 year old female without significant medical history who presents for follow up of migraines. Maxalt has worked well for rescue. She is happy with her current headache control and is not interested in starting a preventive medication at this time. Provided supplement information for migraine prevention. She will be moving to [place] at the end of this month and will plan to establish care with a local provider for ongoing migraine management.  PLAN: -Continue Maxalt 10 mg PRN -Supplement information provided -She will set up care with a local provider in  [place] (moving at the end of this month)  Follow-up: PRN  I spent a total of 15 minutes on the date of the service. Headache education was done. Discussed treatment options including preventive and acute medications, and natural supplements. Discussed medication side effects, adverse reactions and drug interactions. Written educational materials and patient instructions outlining all of the above were given.  [place], MD 11/14/21 2:40 PM

## 2021-11-27 DIAGNOSIS — J029 Acute pharyngitis, unspecified: Secondary | ICD-10-CM | POA: Diagnosis not present

## 2022-06-02 IMAGING — CT CT MAXILLOFACIAL W/O CM
1 series · 15 of 30 positions shown, 19 images · non-contrast
Comparison: None.

CLINICAL DATA: Chronic sinusitis. Maxillary pain intermittent over
the last year.

EXAM:
CT MAXILLOFACIAL WITHOUT CONTRAST
TECHNIQUE: Multidetector CT images of the paranasal sinuses were obtained using
the standard protocol without intravenous contrast.
RADIATION DOSE REDUCTION: This exam was performed according to the
departmental dose-optimization program which includes automated
exposure control, adjustment of the mA and/or kV according to
patient size and/or use of iterative reconstruction technique.

[Series 4: soft tissue · axial · 0.46mm/px · z∈[+904,+1018]mm · 15 of 124 slices shown, 19 images]
[im 5/124  brain]
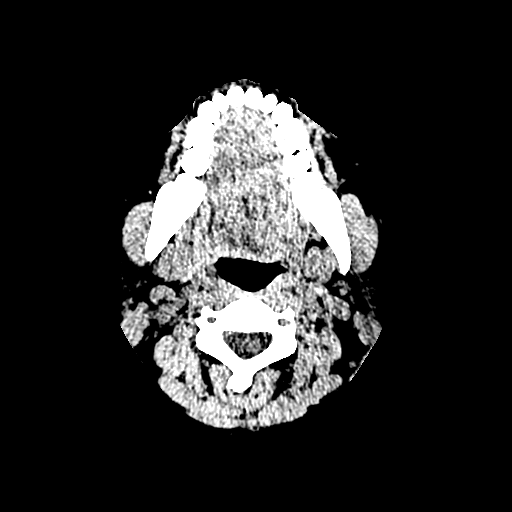
[im 5/124  bone]
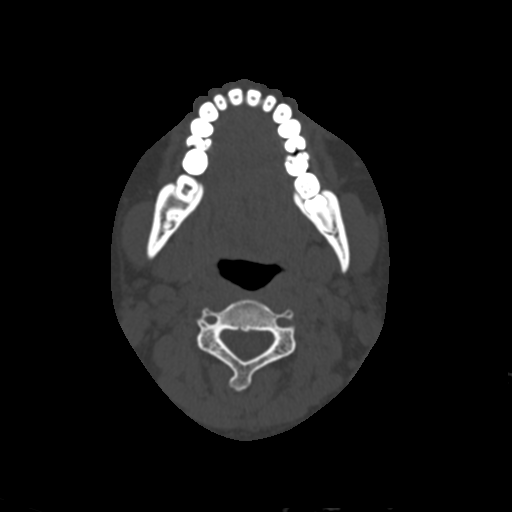
[im 13/124  bone]
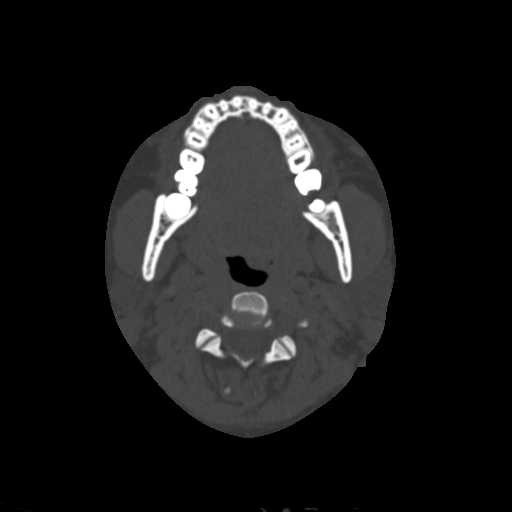
[im 22/124  bone]
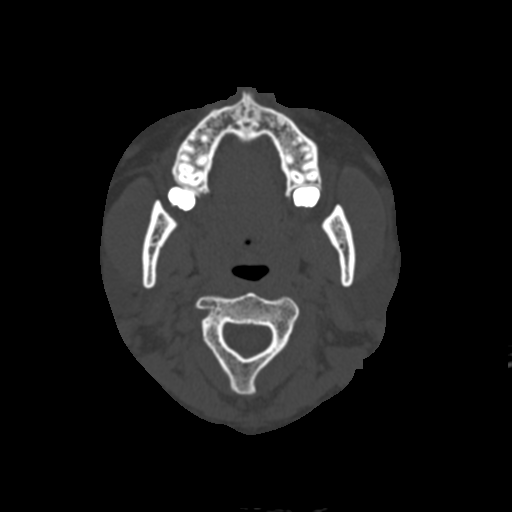
[im 30/124  bone]
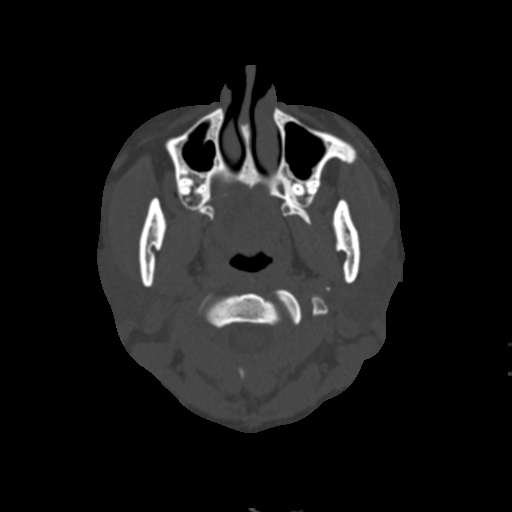
[im 39/124  brain]
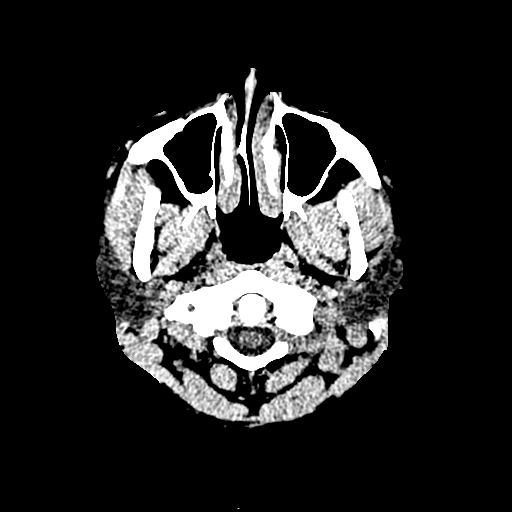
[im 39/124  bone]
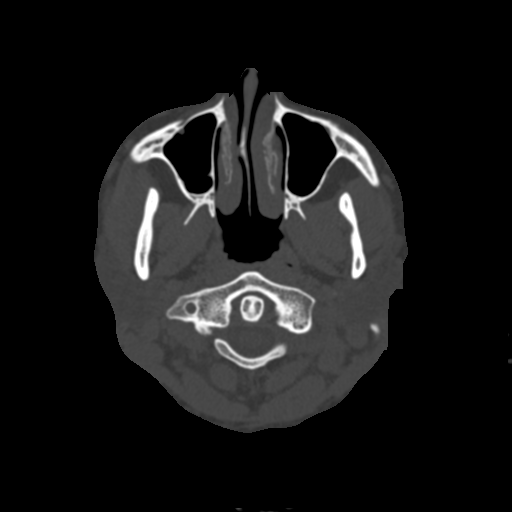
[im 47/124  bone]
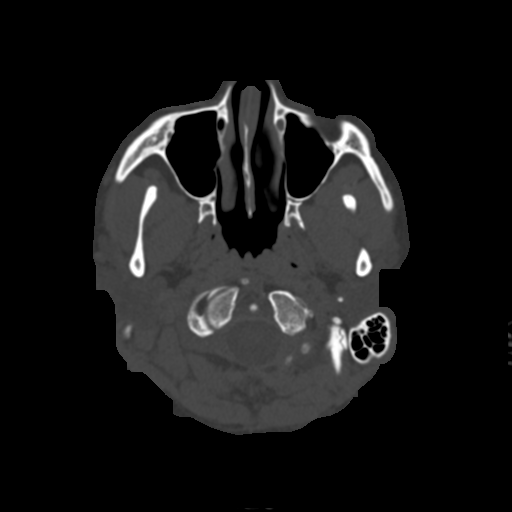
[im 56/124  bone]
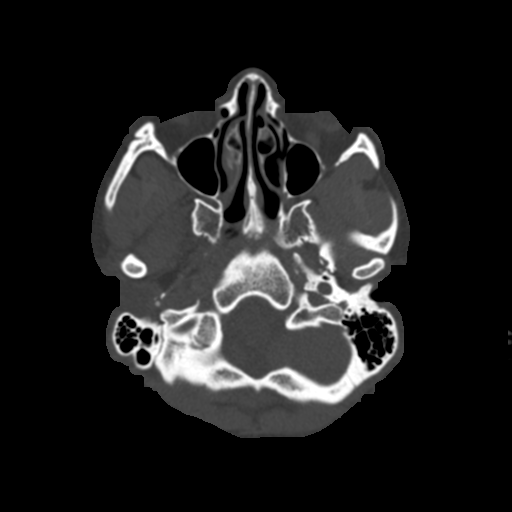
[im 64/124  bone]
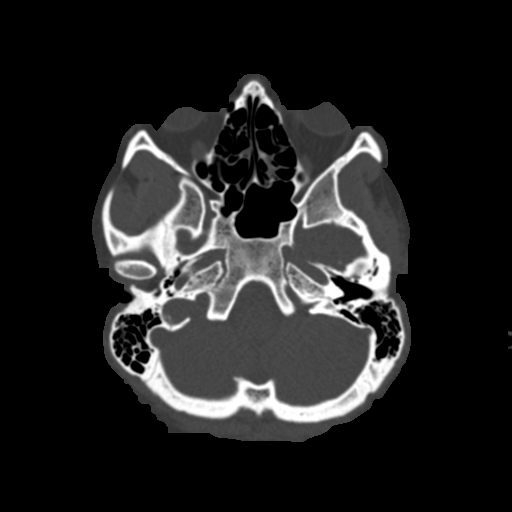
[im 68/124  brain]
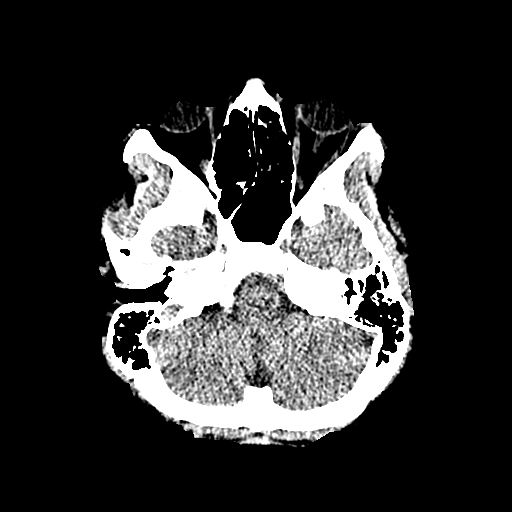
[im 68/124  bone]
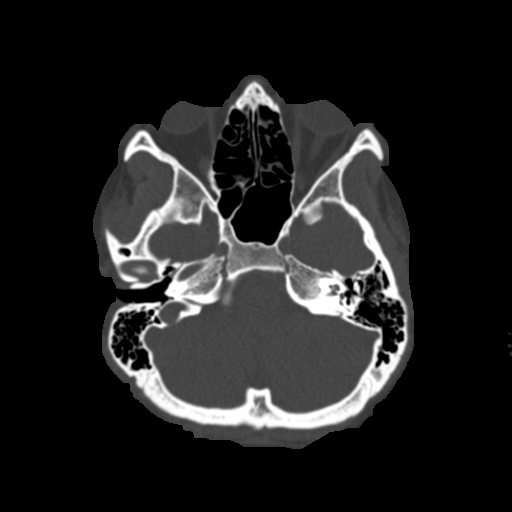
[im 77/124  bone]
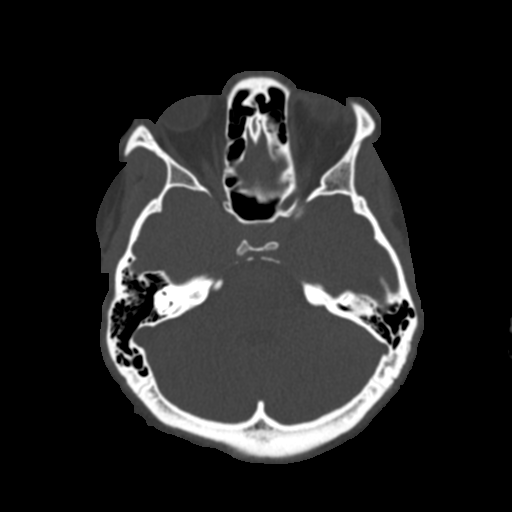
[im 85/124  bone]
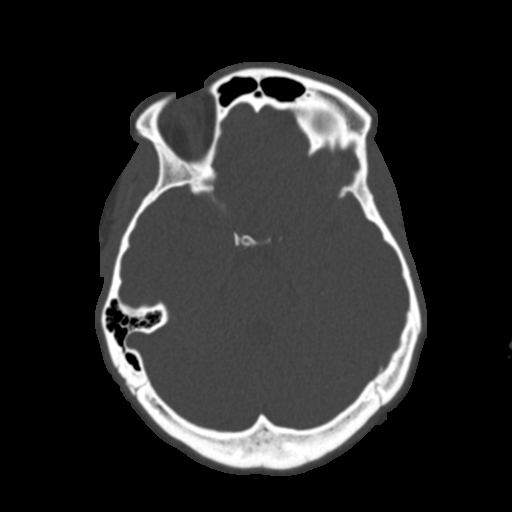
[im 94/124  bone]
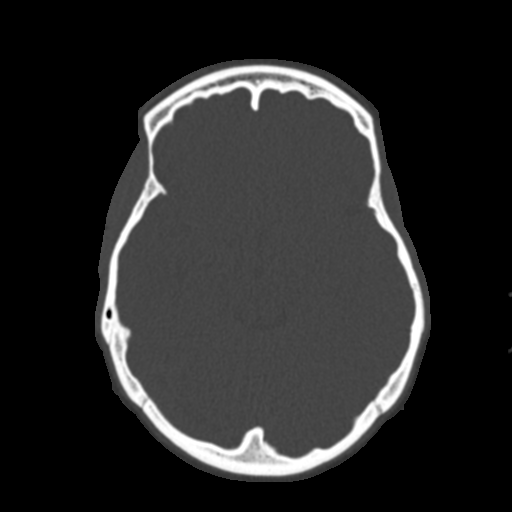
[im 102/124  brain]
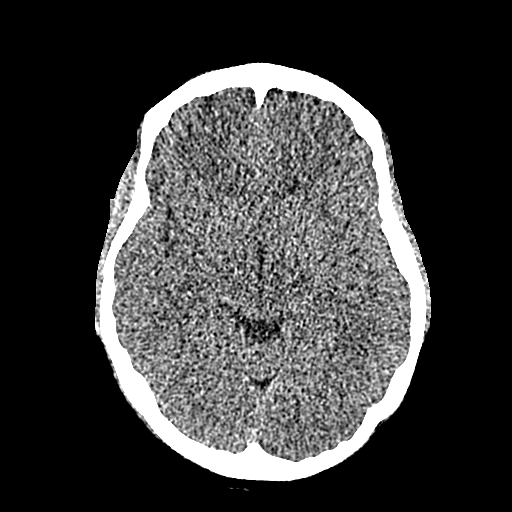
[im 102/124  bone]
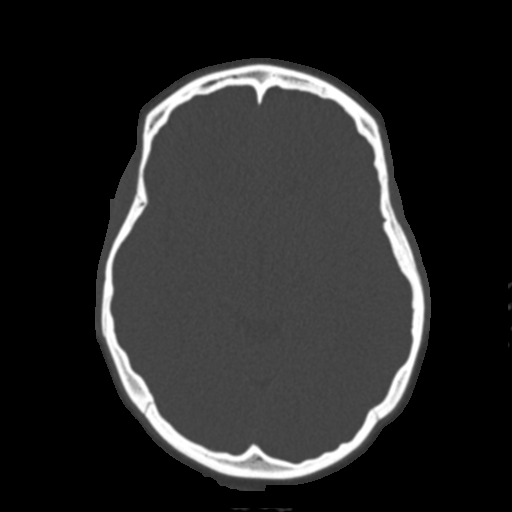
[im 111/124  bone]
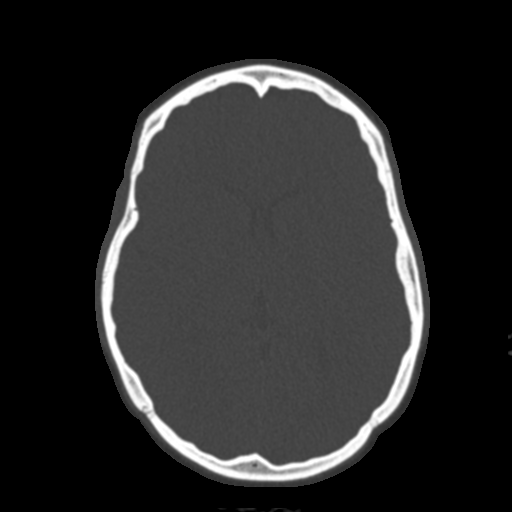
[im 119/124  bone]
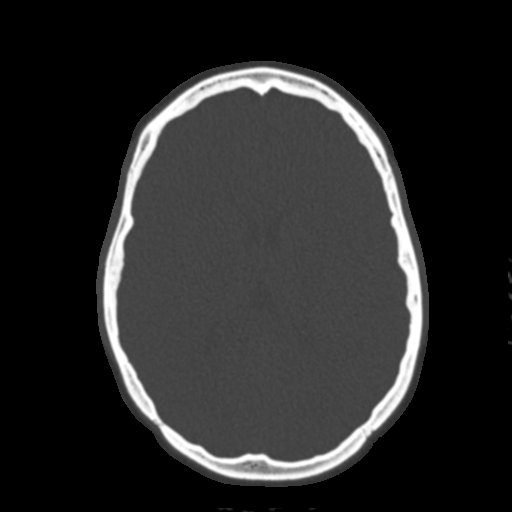

[15 of 30 positions shown; findings below may reference images not displayed]

FINDINGS: Paranasal sinuses:

Frontal: Normally aerated. Patent frontal sinus drainage pathways.

Ethmoid: Normally aerated.

Maxillary: Normally aerated.

Sphenoid: Normally aerated. Patent sphenoethmoidal recesses.

Right ostiomeatal unit: Patent.

Left ostiomeatal unit: Patent.

Nasal passages: Patent. Nasal septum bows 3 mm towards the right
with a right spur.

Anatomy: No pneumatization superior to anterior ethmoid notches.
Symmetric and intact olfactory grooves and fovea ethmoidalis, Keros
II (4-7mm). Presellar sphenoid pneumatization pattern. No dehiscence
of carotid or optic canals. No onodi cell.

Other: None
IMPRESSION: Normal examination. No evidence of acute or chronic sinus
inflammatory disease. Nasal septum does bow about 3 mm towards the
right with a right spur.
